# Patient Record
Sex: Male | Born: 1985 | Race: Black or African American | Hispanic: No | Marital: Single | Smoking: Never smoker
Health system: Southern US, Community
[De-identification: ages and names within clinical notes are randomized; demographics above are authoritative.]

## PROBLEM LIST (undated history)

## (undated) DIAGNOSIS — F329 Major depressive disorder, single episode, unspecified: Secondary | ICD-10-CM

## (undated) DIAGNOSIS — F32A Depression, unspecified: Secondary | ICD-10-CM

---

## 2015-08-27 ENCOUNTER — Emergency Department (HOSPITAL_COMMUNITY): Payer: 59

## 2015-08-27 ENCOUNTER — Emergency Department (HOSPITAL_COMMUNITY)
Admission: EM | Admit: 2015-08-27 | Discharge: 2015-08-27 | Disposition: A | Discharge status: Home / Self Care | Payer: 59 | Attending: Emergency Medicine | Admitting: Emergency Medicine

## 2015-08-27 ENCOUNTER — Encounter (HOSPITAL_COMMUNITY): Payer: Self-pay | Admitting: Emergency Medicine

## 2015-08-27 DIAGNOSIS — R110 Nausea: Secondary | ICD-10-CM | POA: Diagnosis not present

## 2015-08-27 DIAGNOSIS — R1084 Generalized abdominal pain: Secondary | ICD-10-CM | POA: Diagnosis not present

## 2015-08-27 DIAGNOSIS — Z79899 Other long term (current) drug therapy: Secondary | ICD-10-CM | POA: Insufficient documentation

## 2015-08-27 DIAGNOSIS — R109 Unspecified abdominal pain: Secondary | ICD-10-CM | POA: Diagnosis present

## 2015-08-27 DIAGNOSIS — R509 Fever, unspecified: Secondary | ICD-10-CM | POA: Insufficient documentation

## 2015-08-27 DIAGNOSIS — F329 Major depressive disorder, single episode, unspecified: Secondary | ICD-10-CM | POA: Diagnosis not present

## 2015-08-27 DIAGNOSIS — R1011 Right upper quadrant pain: Secondary | ICD-10-CM

## 2015-08-27 HISTORY — DX: Major depressive disorder, single episode, unspecified: F32.9

## 2015-08-27 HISTORY — DX: Depression, unspecified: F32.A

## 2015-08-27 LAB — COMPREHENSIVE METABOLIC PANEL
ALT: 20 U/L (ref 17–63)
ANION GAP: 10 (ref 5–15)
AST: 18 U/L (ref 15–41)
Albumin: 4.6 g/dL (ref 3.5–5.0)
Alkaline Phosphatase: 49 U/L (ref 38–126)
BILIRUBIN TOTAL: 2.6 mg/dL — AB (ref 0.3–1.2)
BUN: 14 mg/dL (ref 6–20)
CO2: 25 mmol/L (ref 22–32)
Calcium: 9.8 mg/dL (ref 8.9–10.3)
Chloride: 103 mmol/L (ref 101–111)
Creatinine, Ser: 0.79 mg/dL (ref 0.61–1.24)
GFR calc Af Amer: >60 mL/min (ref >60)
Glucose, Bld: 96 mg/dL (ref 65–99)
POTASSIUM: 4 mmol/L (ref 3.5–5.1)
Sodium: 138 mmol/L (ref 135–145)
TOTAL PROTEIN: 7.9 g/dL (ref 6.5–8.1)

## 2015-08-27 LAB — CBC
HEMATOCRIT: 47.1 % (ref 39.0–52.0)
Hemoglobin: 15.8 g/dL (ref 13.0–17.0)
MCH: 29.6 pg (ref 26.0–34.0)
MCHC: 33.5 g/dL (ref 30.0–36.0)
MCV: 88.2 fL (ref 78.0–100.0)
Platelets: 170 10*3/uL (ref 150–400)
RBC: 5.34 MIL/uL (ref 4.22–5.81)
RDW: 12.1 % (ref 11.5–15.5)
WBC: 14.4 10*3/uL — AB (ref 4.0–10.5)

## 2015-08-27 LAB — URINALYSIS, ROUTINE W REFLEX MICROSCOPIC
Glucose, UA: NEGATIVE mg/dL
Hgb urine dipstick: NEGATIVE
KETONES UR: 15 mg/dL — AB
NITRITE: NEGATIVE
PROTEIN: 30 mg/dL — AB
SPECIFIC GRAVITY, URINE: 1.037 — AB (ref 1.005–1.030)
UROBILINOGEN UA: 2 mg/dL — AB (ref 0.0–1.0)
pH: 8 (ref 5.0–8.0)

## 2015-08-27 LAB — URINE MICROSCOPIC-ADD ON

## 2015-08-27 LAB — LIPASE, BLOOD: Lipase: 26 U/L (ref 22–51)

## 2015-08-27 MED ORDER — ONDANSETRON HCL 4 MG/2ML IJ SOLN
4.0000 mg | Freq: Once | INTRAMUSCULAR | Status: AC | PRN
  Administered 2015-08-27: 4 mg via INTRAVENOUS
  Filled 2015-08-27: qty 2

## 2015-08-27 MED ORDER — ONDANSETRON 4 MG PO TBDP
4.0000 mg | ORAL_TABLET | Freq: Three times a day (TID) | ORAL | Status: AC | PRN
Start: 2015-08-27 — End: ?

## 2015-08-27 NOTE — ED Notes (Signed)
Patient transported to Ultrasound 

## 2015-08-27 NOTE — ED Notes (Signed)
PT remains in US 

## 2015-08-27 NOTE — ED Notes (Signed)
Pt reports nausea doing better.

## 2015-08-27 NOTE — Discharge Instructions (Signed)
Please take Zofran as prescribed for nausea.  Please follow up with your primary care provider at [facility] practice for follow up in 2-3 days. Please return to the Emergency department if symptoms worsen or new onset fever, vomiting, diarrhea, blood in emesis or stool.

## 2015-08-27 NOTE — ED Notes (Signed)
Pt reports seen by PMD 1 week ago for depression. Was prescribed new medication. Reports wasn't able to eat at all last week. Seen again yesterday by his doctor and given a new medication. Pt states his abdomen has been hurting allover since last night. Reports subjective fever at home this AM.

## 2015-08-27 NOTE — ED Notes (Signed)
PT ambulated with baseline gait; VSS; A&Ox3; no signs of distress; respirations even and unlabored; skin warm and dry; no questions upon discharge.  

## 2015-08-27 NOTE — ED Provider Notes (Signed)
CSN: [id number]     Arrival date & time 08/27/15  1053 History   First MD Initiated Contact with Patient 08/27/15 1209     Chief Complaint  Patient presents with  . Abdominal Pain     (Consider location/radiation/quality/duration/timing/severity/associated sxs/prior Treatment) HPI Comments: Pt is a 29 yo male who presents to the ED with complaint of abdominal pain, onset this morning. Pt reports generalized abdominal cramping that started this morning and worsened when he tried to eat something. He states he was recently diagnosed with depression by his PCP and was placed on a medication last week and saw his PCP again yesterday and was given a new medication. He reports he took the new med last night and this morning without eating. Endorses fever, nausea. He reports he has had decreased appetite, weakness and weight loss prior to starting the new medications. Denies headache, SOB, CP, vomiting, diarrhea, constipation, blood in urine or stool, urinary sxs, numbness, tingling.   Patient is a 29 y.o. male presenting with abdominal pain.  Abdominal Pain Associated symptoms: fever and nausea     Past Medical History  Diagnosis Date  . Depression    History reviewed. No pertinent past surgical history. History reviewed. No pertinent family history. Social History  Substance Use Topics  . Smoking status: Never Smoker   . Smokeless tobacco: None  . Alcohol Use: No    Review of Systems  Constitutional: Positive for fever and appetite change.  Gastrointestinal: Positive for nausea and abdominal pain.  Neurological: Positive for weakness.  All other systems reviewed and are negative.     Allergies  Review of patient's allergies indicates no known allergies.  Home Medications   Prior to Admission medications   Medication Sig Start Date End Date Taking? Authorizing Provider  meloxicam (MOBIC) 15 MG tablet Take 15 mg by mouth daily.   Yes Historical Provider, MD  sertraline  (ZOLOFT) 50 MG tablet Take 50 mg by mouth daily.   Yes Historical Provider, MD   BP 110/67 mmHg  Pulse 85  Temp(Src) 98.8 F (37.1 C) (Oral)  Resp 16  SpO2 95% Physical Exam  Constitutional: He is oriented to person, place, and time. He appears well-developed and well-nourished.  HENT:  Head: Normocephalic and atraumatic.  Mouth/Throat: Oropharynx is clear and moist.  Eyes: Conjunctivae and EOM are normal. Right eye exhibits no discharge. Left eye exhibits no discharge. No scleral icterus.  Neck: Normal range of motion. Neck supple.  Cardiovascular: Normal rate, regular rhythm, normal heart sounds and intact distal pulses.   Pulmonary/Chest: Effort normal and breath sounds normal. He has no wheezes. He has no rales. He exhibits no tenderness.  Abdominal: Soft. Bowel sounds are normal. He exhibits no mass. There is tenderness in the right upper quadrant and epigastric area. There is no rebound and no guarding.  Musculoskeletal: He exhibits no edema.  Lymphadenopathy:    He has no cervical adenopathy.  Neurological: He is alert and oriented to person, place, and time.  Skin: Skin is warm and dry.  Nursing note and vitals reviewed.   ED Course  Procedures (including critical care time) Labs Review Labs Reviewed  COMPREHENSIVE METABOLIC PANEL - Abnormal; Notable for the following:    Total Bilirubin 2.6 (*)    All other components within normal limits  CBC - Abnormal; Notable for the following:    WBC 14.4 (*)    All other components within normal limits  LIPASE, BLOOD  URINALYSIS, ROUTINE W REFLEX MICROSCOPIC (NOT  AT [facility])    Imaging Review No results found. I have personally reviewed and evaluated these images and lab results as part of my medical decision-making.  Filed Vitals:   08/27/15 1300  BP: 118/70  Pulse: 61  Temp:   Resp:    Meds given in ED:  Medications  ondansetron (ZOFRAN) injection 4 mg (4 mg Intravenous Given 08/27/15 1219)    New Prescriptions    ONDANSETRON (ZOFRAN ODT) 4 MG DISINTEGRATING TABLET    Take 1 tablet (4 mg total) by mouth every 8 (eight) hours as needed for nausea or vomiting.      MDM   Final diagnoses:  Generalized abdominal pain    Pt presents with abdominal pain, nausea. Denies vomiting, diarrhea, urinary sxs, constipation. No prior history of abdominal problems or surgeries. VSS, afebrile. Pt reports nausea improved with zofran in ED.   UA, CMP and lipase unremarkable. Elevated WBC (14). Ordered US of RUQ to r/o cholecystitis. [place] of RUQ normal. I do suspect infectious etiology causing abdominal pain. Pain likely due to side effect from depression medication. Plan to d/c pt home with rx for Zofran and to follow up with his PCP in 2 days.  Evaluation does not show pathology requring ongoing emergent intervention or admission. Pt is hemodynamically stable and mentating appropriately. Discussed findings/results and plan with patient/guardian, who agrees with plan. All questions answered. Return precautions discussed and outpatient follow up given.      Satira Sark [place], [place] 08/27/15 1440  Lyndal Pulley, MD 08/28/15 [phone]

## 2016-06-18 IMAGING — US US ABDOMEN LIMITED
1 series · 14 of 24 positions shown · non-contrast
Comparison: None.

CLINICAL DATA: Right upper quadrant pain

EXAM:
US ABDOMEN LIMITED - RIGHT UPPER QUADRANT

[Series 1: us abdomen limited · 0.14mm/px · 14 of 24 slices shown]
[im 1/24]
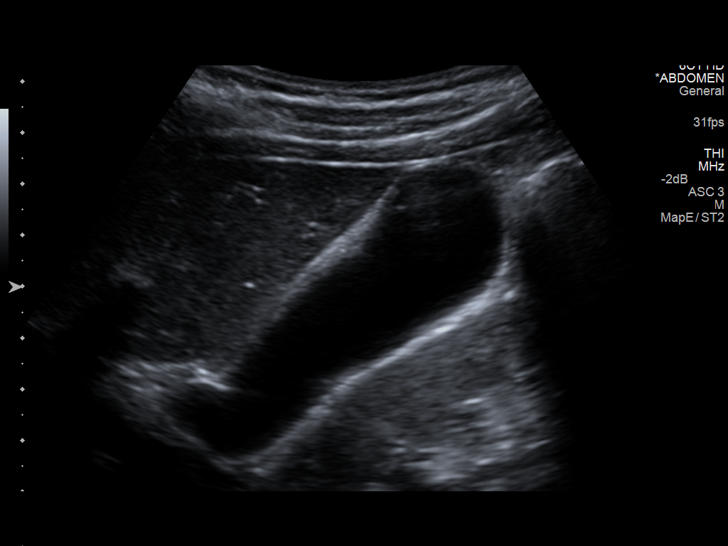
[im 3/24]
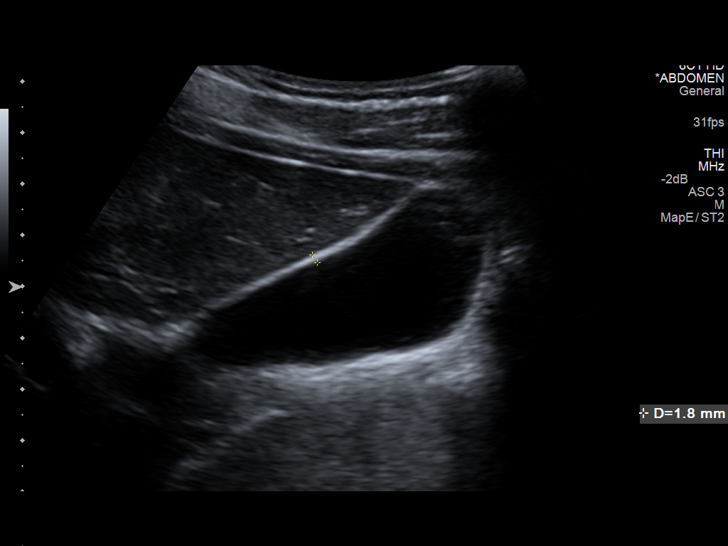
[im 5/24]
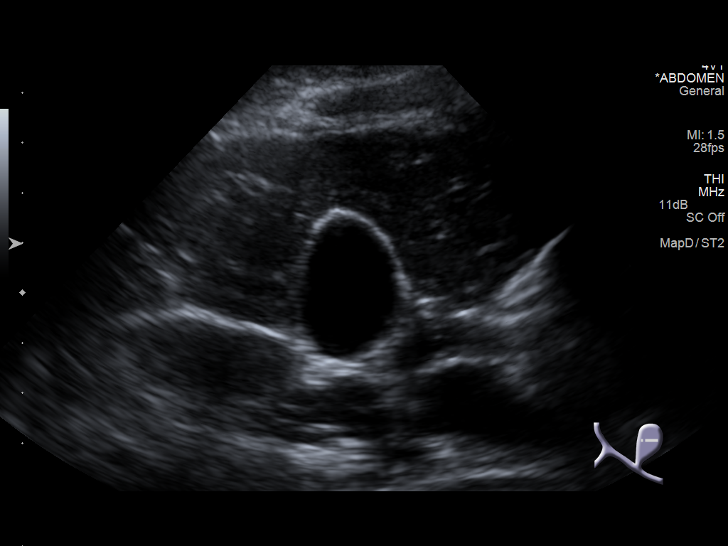
[im 7/24]
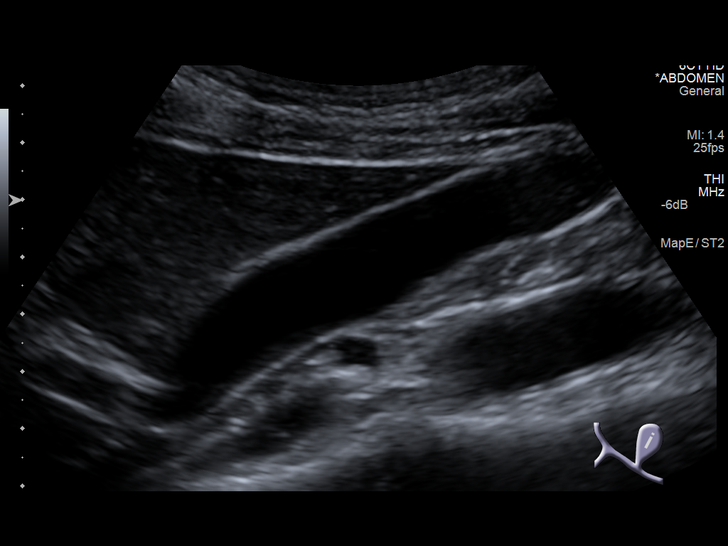
[im 8/24]
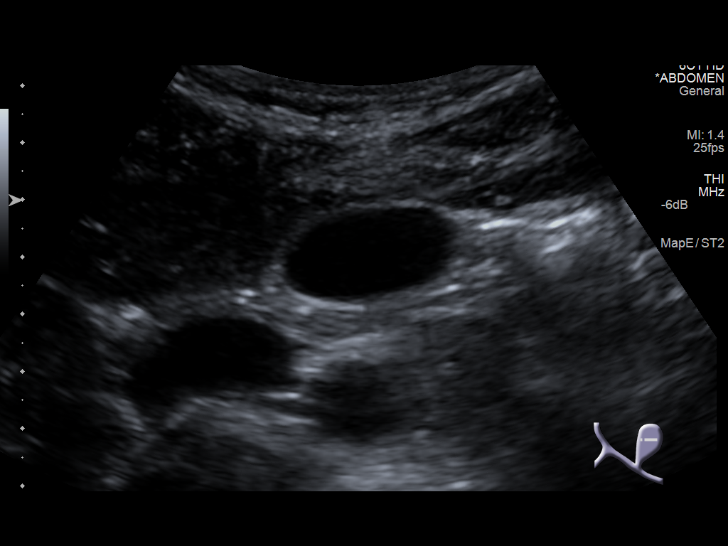
[im 10/24]
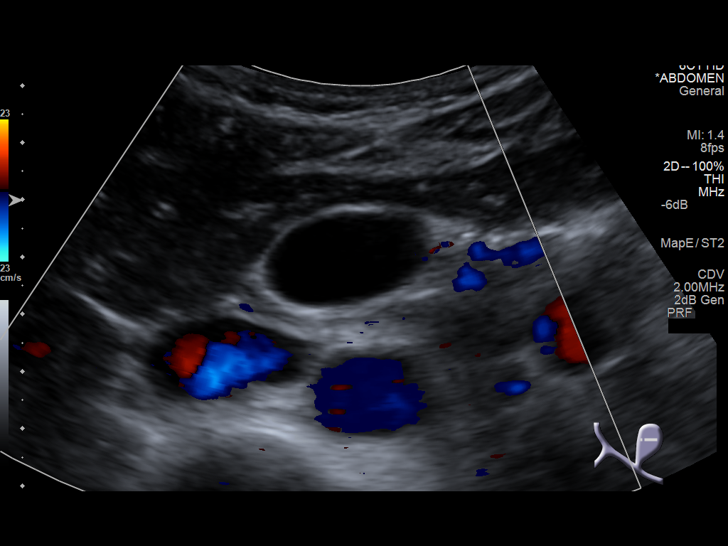
[im 12/24]
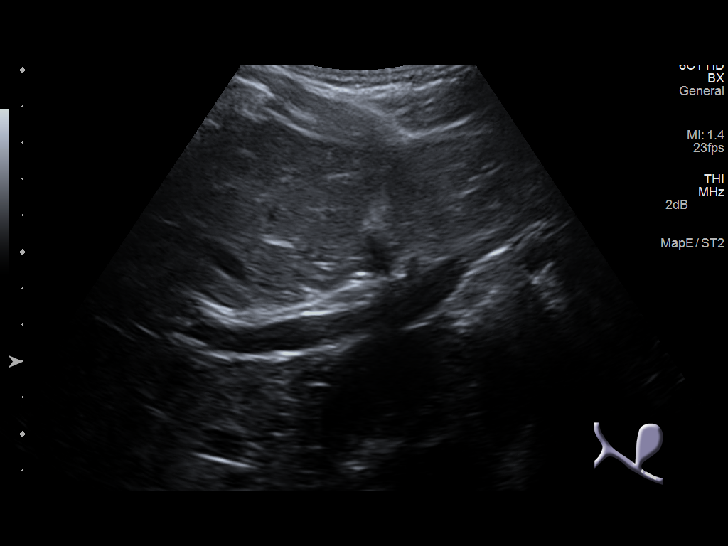
[im 13/24]
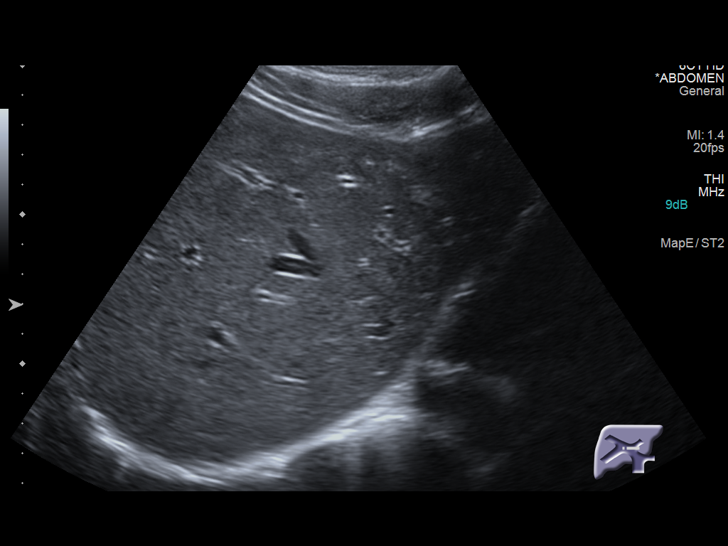
[im 15/24]
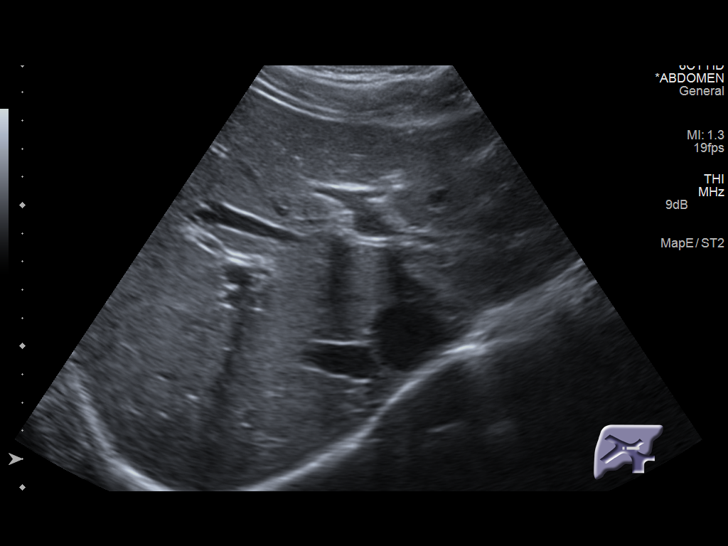
[im 17/24]
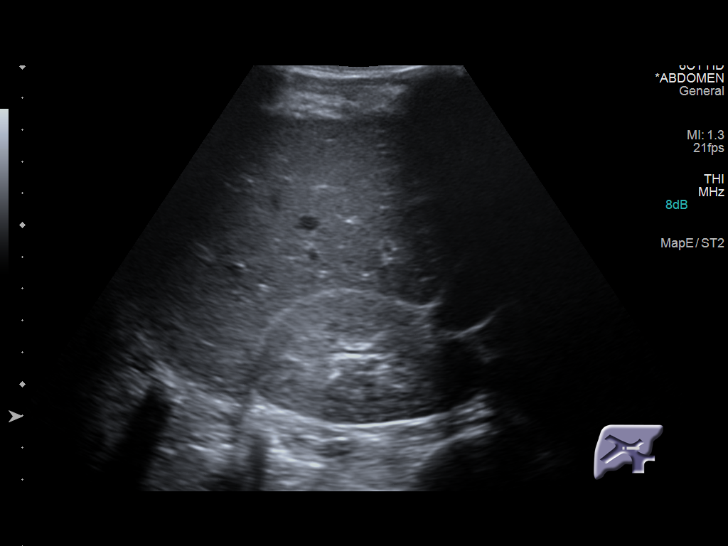
[im 19/24]
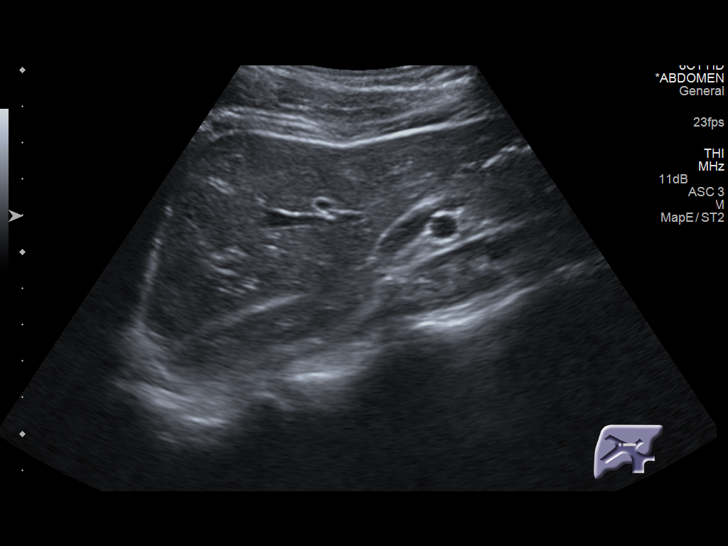
[im 20/24]
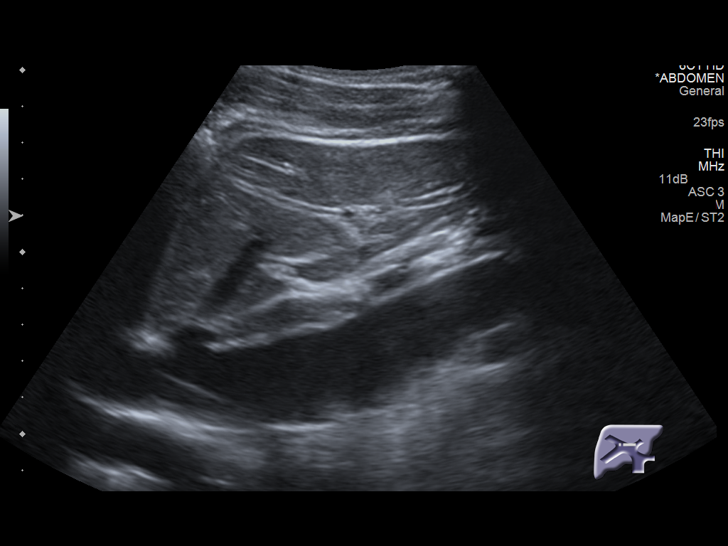
[im 22/24]
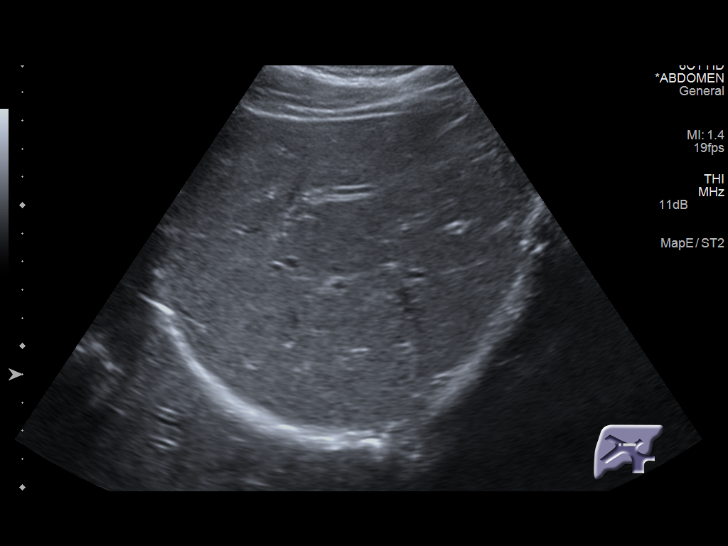
[im 24/24]
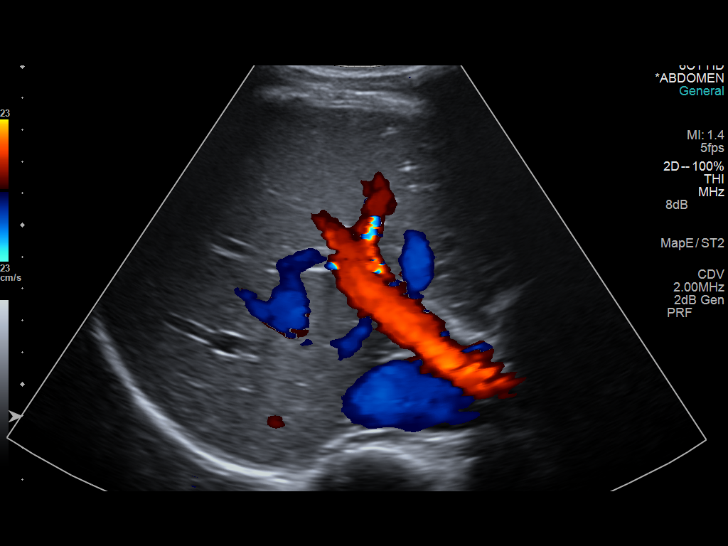

[14 of 24 positions shown; findings below may reference images not displayed]

FINDINGS: Gallbladder:

No gallstones or wall thickening visualized. No sonographic Murphy
sign noted.

Common bile duct:

Diameter: 2 mm

Liver:

No focal lesion identified. Within normal limits in parenchymal
echogenicity. Antegrade flow in the imaged portal venous system.
IMPRESSION: Normal right upper quadrant ultrasound.
